# Patient Record
Sex: Female | Born: 1970 | Race: White | Hispanic: No | Marital: Married | Smoking: Never smoker
Health system: Southern US, Community
[De-identification: ages and names within clinical notes are randomized; demographics above are authoritative.]

## PROBLEM LIST (undated history)

## (undated) DIAGNOSIS — N6019 Diffuse cystic mastopathy of unspecified breast: Secondary | ICD-10-CM

## (undated) DIAGNOSIS — G4720 Circadian rhythm sleep disorder, unspecified type: Secondary | ICD-10-CM

## (undated) DIAGNOSIS — N920 Excessive and frequent menstruation with regular cycle: Secondary | ICD-10-CM

## (undated) HISTORY — DX: Diffuse cystic mastopathy of unspecified breast: N60.19

## (undated) HISTORY — DX: Circadian rhythm sleep disorder, unspecified type: G47.20

## (undated) HISTORY — PX: BREAST SURGERY: SHX581

## (undated) HISTORY — DX: Excessive and frequent menstruation with regular cycle: N92.0

## (undated) HISTORY — PX: OTHER SURGICAL HISTORY: SHX169

---

## 1991-06-15 HISTORY — PX: BREAST EXCISIONAL BIOPSY: SUR124

## 2006-11-17 ENCOUNTER — Ambulatory Visit: Payer: Self-pay

## 2007-10-09 ENCOUNTER — Ambulatory Visit: Payer: Self-pay | Admitting: Cardiology

## 2011-03-18 ENCOUNTER — Ambulatory Visit: Payer: Self-pay | Admitting: Internal Medicine

## 2011-04-01 ENCOUNTER — Ambulatory Visit: Payer: Self-pay | Admitting: Internal Medicine

## 2011-04-01 DIAGNOSIS — Z0289 Encounter for other administrative examinations: Secondary | ICD-10-CM

## 2012-03-30 LAB — HM PAP SMEAR: HM PAP: NEGATIVE

## 2012-04-20 ENCOUNTER — Ambulatory Visit: Payer: Self-pay | Admitting: Obstetrics and Gynecology

## 2013-04-11 ENCOUNTER — Ambulatory Visit: Payer: Self-pay | Admitting: Emergency Medicine

## 2013-04-11 LAB — COMPREHENSIVE METABOLIC PANEL
Albumin: 4 g/dL (ref 3.4–5.0)
Alkaline Phosphatase: 53 U/L (ref 50–136)
BUN: 11 mg/dL (ref 7–18)
Bilirubin,Total: 0.3 mg/dL (ref 0.2–1.0)
Calcium, Total: 9 mg/dL (ref 8.5–10.1)
Co2: 27 mmol/L (ref 21–32)
Creatinine: 0.73 mg/dL (ref 0.60–1.30)
Glucose: 126 mg/dL — ABNORMAL HIGH (ref 65–99)
Osmolality: 278 (ref 275–301)
SGOT(AST): 15 U/L (ref 15–37)
SGPT (ALT): 31 U/L (ref 12–78)
Sodium: 139 mmol/L (ref 136–145)
Total Protein: 7.7 g/dL (ref 6.4–8.2)

## 2013-04-11 LAB — CBC WITH DIFFERENTIAL/PLATELET
Basophil #: 0.1 10*3/uL (ref 0.0–0.1)
Basophil %: 0.8 %
HCT: 36.2 % (ref 35.0–47.0)
HGB: 12.3 g/dL (ref 12.0–16.0)
Lymphocyte %: 22.8 %
MCHC: 34.1 g/dL (ref 32.0–36.0)
MCV: 89 fL (ref 80–100)
Monocyte %: 8.8 %
Neutrophil #: 5.2 10*3/uL (ref 1.4–6.5)
Neutrophil %: 66.7 %
Platelet: 283 10*3/uL (ref 150–440)
RBC: 4.07 10*6/uL (ref 3.80–5.20)
RDW: 12.4 % (ref 11.5–14.5)

## 2013-04-11 LAB — TROPONIN I: Troponin-I: <0.02 ng/mL

## 2013-08-23 ENCOUNTER — Ambulatory Visit: Payer: Self-pay | Admitting: Obstetrics and Gynecology

## 2014-05-16 IMAGING — CR DG CHEST 2V
1 series · 2 of 2 positions shown · non-contrast
Comparison: none

REASON FOR EXAM: Dx: chest pain
COMMENTS:

PROCEDURE:     DXR - DXR CHEST PA (OR AP) AND LATERAL  - April 11, 2013  [DATE]
RESULT:     The lungs are clear. The heart and pulmonary vessels are normal.
The bony and mediastinal structures are unremarkable. There is no effusion.
There is no pneumothorax or evidence of congestive failure.

[Series 1: w chest pa · 0.14mm/px · 2 of 2 slices shown]
[im 1/2]
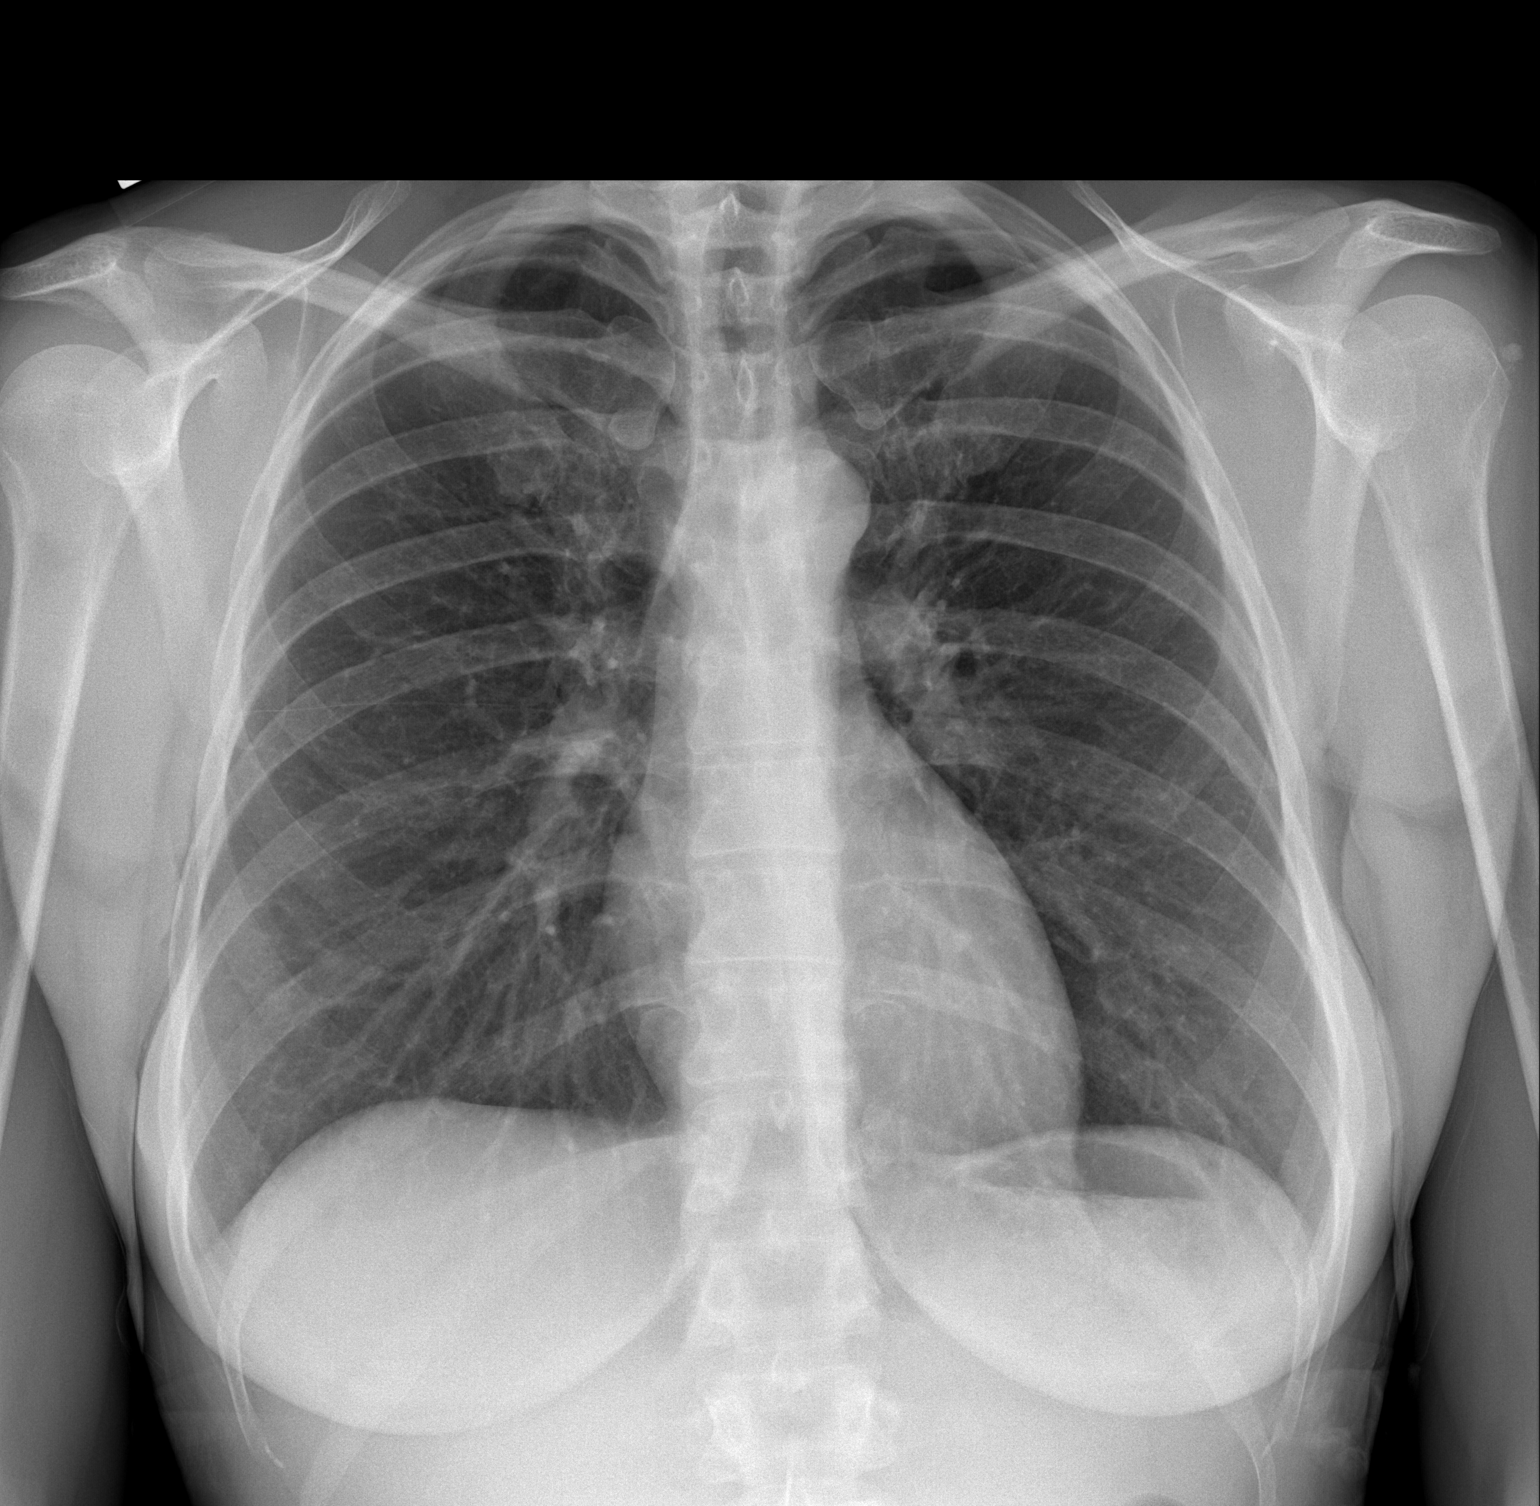
[im 2/2]
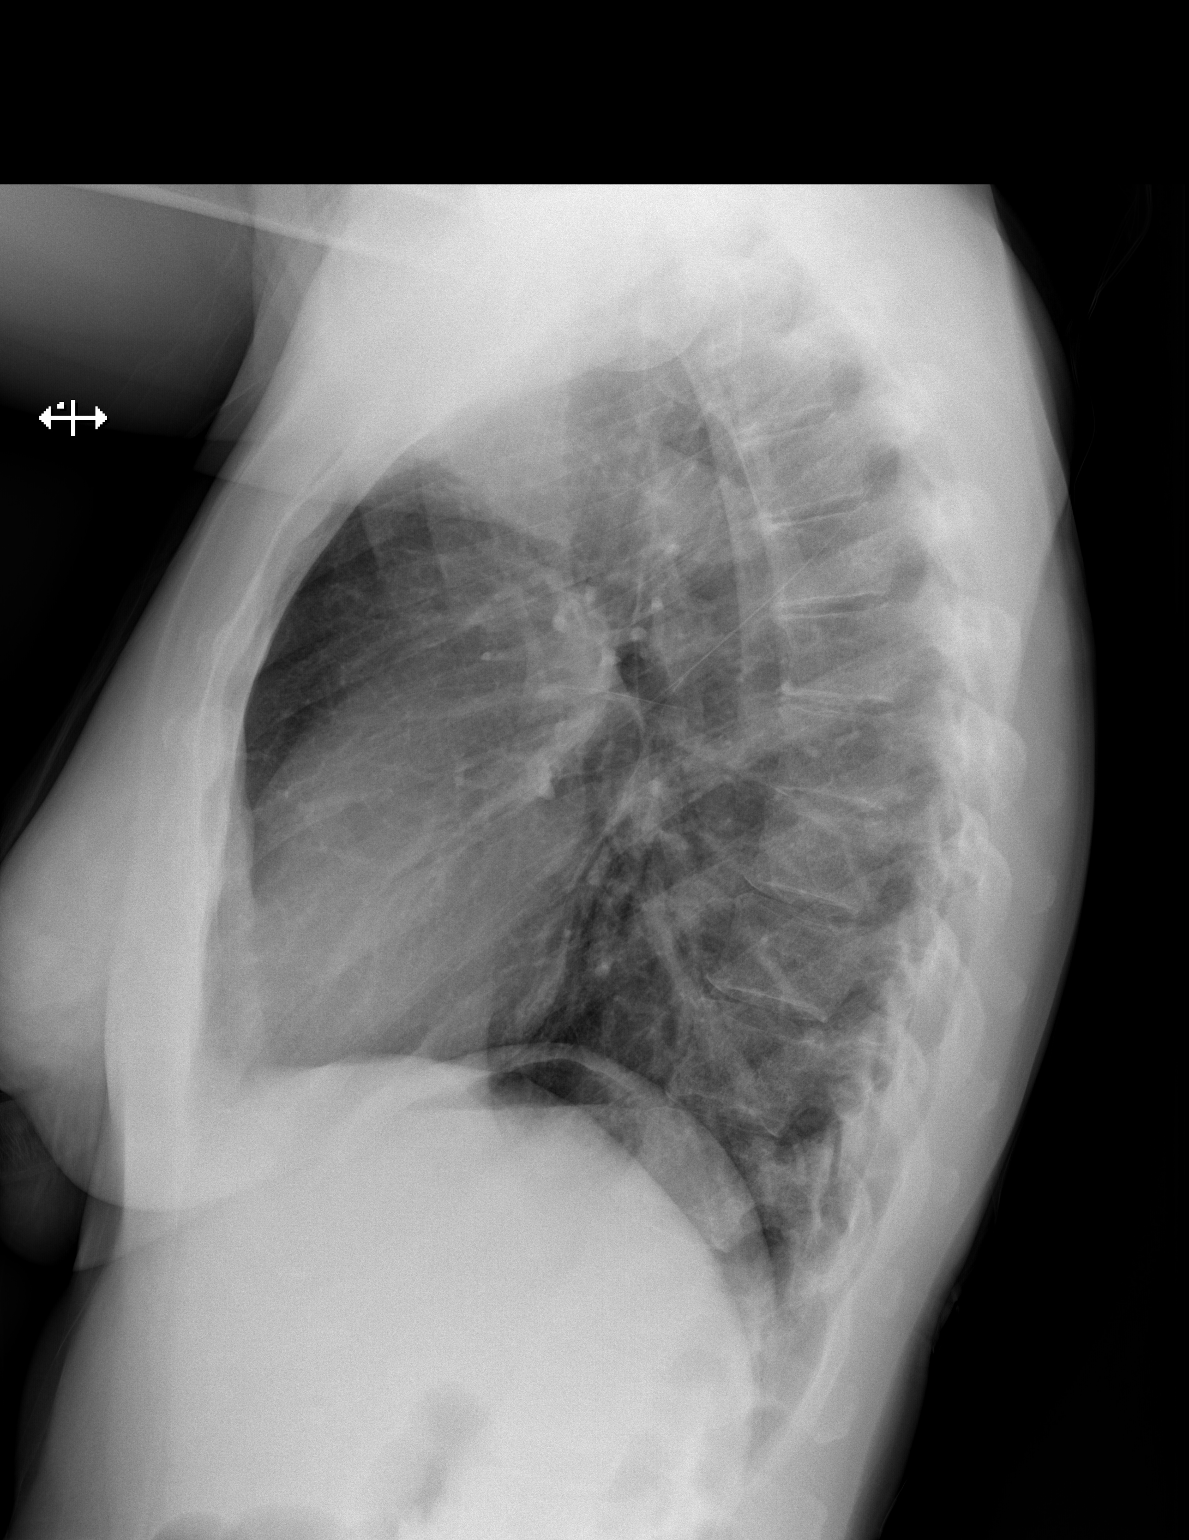

[2 of 2 positions shown; findings below may reference images not displayed]

IMPRESSION: No acute cardiopulmonary disease.

[REDACTED]

## 2015-03-04 ENCOUNTER — Encounter: Payer: Self-pay | Admitting: Podiatry

## 2015-03-04 ENCOUNTER — Ambulatory Visit (INDEPENDENT_AMBULATORY_CARE_PROVIDER_SITE_OTHER): Payer: BLUE CROSS/BLUE SHIELD | Admitting: Podiatry

## 2015-03-04 VITALS — BP 114/70 | HR 83 | Resp 18

## 2015-03-04 DIAGNOSIS — B351 Tinea unguium: Secondary | ICD-10-CM | POA: Diagnosis not present

## 2015-03-04 MED ORDER — CICLOPIROX 8 % EX SOLN: Freq: Every day | CUTANEOUS | Status: DC

## 2015-03-04 NOTE — Patient Instructions (Signed)

## 2015-03-04 NOTE — Progress Notes (Signed)
   Subjective:    Patient ID: Cheryl Bond, female    DOB: 12/26/1970, 44 y.o.   MRN: [id number]  HPI  44 year old female presents the office so concerns of toenail fungus to both of her big toes which is been ongoing the last several months. She states that she did have injury to the right big toenail proximal and one year ago when a stretcher rolled over her big toe. She does gets pedicures. She has tried over-the-counter treatments for 1 month for toenail fungus. She states that helps some although did not relieve her symptoms completely. She denies any pain to the toenail this time and she denies any surrounding redness or drainage. She states the right side is worse than the left. No other complaints at this time  Review of Systems  All other systems reviewed and are negative.      Objective:   Physical Exam AAO x3, NAD DP/PT pulses palpable bilaterally, CRT less than 3 seconds Protective sensation intact with Simms Weinstein monofilament, vibratory sensation intact, Achilles tendon reflex intact Bilateral hallux nails are hypertrophic, dystrophic, brittle, discolored, elongated with the right side worse the left. There is no surrounding erythema or drainage. There is no tenderness to palpation around the toenails. No areas of tenderness to bilateral lower extremities. MMT 5/5, ROM WNL.  No open lesions or pre-ulcerative lesions.  No overlying edema, erythema, increase in warmth to bilateral lower extremities.  No pain with calf compression, swelling, warmth, erythema bilaterally.      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 Nails were sent to [facility] for evaluation of onyhcomycosis. She will likely proceed with topical treatment. Will go ahead with Penlac.  -Follow-up 3 months  or sooner if any problems arise. In the meantime, encouraged to call the office with any questions, concerns, change in symptoms.    Celesta Gentile, DPM

## 2015-03-25 ENCOUNTER — Telehealth: Payer: Self-pay | Admitting: *Deleted

## 2015-03-25 NOTE — Telephone Encounter (Signed)
Pt states received a call from [facility] stating biopsy results in make an appt as soon as possible.  Referred to schedulers to call pt.

## 2015-04-01 ENCOUNTER — Ambulatory Visit (INDEPENDENT_AMBULATORY_CARE_PROVIDER_SITE_OTHER): Payer: BLUE CROSS/BLUE SHIELD | Admitting: Podiatry

## 2015-04-01 ENCOUNTER — Encounter: Payer: Self-pay | Admitting: Podiatry

## 2015-04-01 VITALS — BP 107/70 | HR 72 | Resp 18

## 2015-04-01 DIAGNOSIS — B351 Tinea unguium: Secondary | ICD-10-CM

## 2015-04-01 MED ORDER — EFINACONAZOLE 10 % EX SOLN: 1.0000 [drp] | Freq: Every day | CUTANEOUS | Status: DC

## 2015-04-03 NOTE — Progress Notes (Signed)
    Patient ID: NASHIYA DISBROW, female    DOB: May 13, 1971, 43 y.o.   MRN: [id number]  Subjective: 44 year old female presents the office to discuss nail biopsy results. She's been using the Penlac has not noticed any change. She continues to the both of her big toenails are thick and discolored. Denies any pain to the toenails. Denies any surrounding redness or drainage. No other complaints at this time.  Objective: AAO x3, NAD DP/PT pulses palpable bilaterally, CRT less than 3 seconds Protective sensation intact with Simms Weinstein monofilament Bilateral hallux nails continue to be hypertrophic, dystrophic, brittle, discolored, elongated with the right side worse the left. There is no surrounding erythema or drainage. There is no tenderness to palpation around the toenails. No areas of tenderness to bilateral lower extremities. No open lesions or pre-ulcerative lesions.  No overlying edema, erythema, increase in warmth to bilateral lower extremities.  No pain with calf compression, swelling, warmth, erythema bilaterally.   44 year old female with bilateral hallux onychodystrophy, likely onychomycosis -Treatment options discussed including all alternatives, risks, and complications -She is noticing any improvement with the Penlac. I discussed the nail biopsy results. Discussed with the oral treatment is likely more effective for her particular type of fungus how she wishes to hold off on pills. We will try Jublia. Application instructions and side effects discussed.    Celesta Gentile, DPM

## 2015-04-10 ENCOUNTER — Encounter: Payer: Self-pay | Admitting: *Deleted

## 2015-04-11 ENCOUNTER — Other Ambulatory Visit: Payer: Self-pay | Admitting: Obstetrics and Gynecology

## 2015-04-11 ENCOUNTER — Encounter: Payer: Self-pay | Admitting: Obstetrics and Gynecology

## 2015-04-11 ENCOUNTER — Ambulatory Visit: Payer: BLUE CROSS/BLUE SHIELD | Admitting: Obstetrics and Gynecology

## 2015-04-11 VITALS — BP 104/73 | HR 71 | Ht 66.0 in | Wt 155.4 lb

## 2015-04-11 DIAGNOSIS — Z01419 Encounter for gynecological examination (general) (routine) without abnormal findings: Secondary | ICD-10-CM

## 2015-04-11 NOTE — Progress Notes (Incomplete)
[name]  ENCOUNTER NOTE  Subjective:       Cheryl Bond is a 44 y.o. No obstetric history on file. female here for a routine annual gynecologic exam.  Current complaints: 1.  Annual well exam. Normal cycles, mild dysmenorrhea/mastalgia, no vaginal discharge or itching, no abnormal bleeding or spotting. Denies urinary symptoms, change in BM. Denies hot flashes, night sweats. 2.  Weight gain - had episode of bursitis and has decreased exercise since then   Gynecologic History Patient's last menstrual period was 03/17/2015 (exact date). Regular 25 day cycles with 4 days of bleeding.  Contraception: vasectomy Last Pap: . Results were: normal - never had abnormal Last mammogram: 2014 - not exact. Results were: normal. History of fibrocystic changes in 1990's.  Performs frequent self breast exams  Obstetric History OB History  No data available    Past Medical History  Diagnosis Date  . Fibrocystic breast     Past Surgical History  Procedure Laterality Date  . Toenail surgery      BI-LATERAL GREAT TOENAILS REMOVED  . Breast surgery      RIGHT BREAST [phone]    Current Outpatient Prescriptions on File Prior to Visit  Medication Sig Dispense Refill  . ciclopirox (PENLAC) 8 % solution Apply topically at bedtime. Apply over nail and surrounding skin. Apply daily over previous coat. After seven (7) days, may remove with alcohol and continue cycle. (Patient not taking: Reported on 04/11/2015) 6.6 mL 3  . Efinaconazole 10 % SOLN Apply 1 drop topically daily. (Patient not taking: Reported on 04/11/2015) 4 mL 11   No current facility-administered medications on file prior to visit.    No Known Allergies  Social History   Social History  . Marital Status: Married    Spouse Name: N/A  . Number of Children: N/A  . Years of Education: N/A   Occupational History  . Not on file.   Social History Main Topics  . Smoking status: Never Smoker   . Smokeless  tobacco: Never Used  . Alcohol Use: No  . Drug Use: No  . Sexual Activity: Yes     Comment: husband-vasectomy   Other Topics Concern  . Not on file   Social History Narrative    Family History  Problem Relation Age of Onset  . Heart disease Maternal Grandmother   . Heart disease Paternal Grandmother   . Heart disease Paternal Grandfather     The following portions of the patient's history were reviewed and updated as appropriate: allergies, current medications, past family history, past medical history, past social history, past surgical history and problem list.  Review of Systems ROS Review of Systems -  General ROS: negative for - chills, fatigue, fever, hot flashes, night sweats. Positive for weight gain. Hematological and Lymphatic ROS: negative for - bleeding problems, bruising, swollen lymph nodes or weight loss Endocrine ROS: negative for - hot flashes, malaise/lethargy, palpitations, fatigue Breast ROS: negative for - new or changing breast lumps or nipple discharge Respiratory ROS: negative for - cough or shortness of breath Cardiovascular ROS: negative for - chest pain, irregular heartbeat Gastrointestinal ROS: no abdominal pain, change in bowel habits, or black or bloody stools Genito-Urinary ROS: no dysuria, trouble voiding, or hematuria Musculoskeletal ROS: negative for - joint pain or joint stiffness Neurological ROS: negative for - bowel and bladder control changes Dermatological ROS: negative for rash and skin lesion changes   Objective:   BP 104/73 mmHg  Pulse 71  Ht 5' 6"  (1.676 m)  Wt 155 lb 6.4 oz (70.489 kg)  BMI 25.09 kg/m2  LMP 03/17/2015 (Exact Date) CONSTITUTIONAL: Well-developed, well-nourished female in no acute distress.  PSYCHIATRIC: Normal mood and affect. Normal behavior.[name] [place]: Alert and oriented to person, place, and time. No cranial nerve deficit noted. HENT:  Normocephalic, atraumatic. Oropharynx is clear and moist EYES:  Conjunctivae and EOM are normal.  NECK: Normal range of motion, supple, no masses.  Normal thyroid.  SKIN: Skin is warm and dry. No rash noted. Tan. CARDIOVASCULAR: Normal heart rate noted, regular rhythm, no murmur. RESPIRATORY: Clear to auscultation bilaterally. Effort and breath sounds normal. BREASTS: Symmetric in size. No masses, skin changes, nipple drainage, or lymphadenopathy. ABDOMEN: Soft, normal bowel sounds, no distention noted.  No tenderness, rebound or guarding.  BLADDER: Normal PELVIC:  External Genitalia: Normal  BUS: Normal  Vagina: Normal  Cervix: Normal  Uterus: Normal, mid line  Adnexa: No masses palpable, nontender  RV: External Exam NormaI  LYMPHATIC: No Axillary, Supraclavicular, or Inguinal Adenopathy.    Assessment:   Annual gynecologic examination 44 y.o. - Well woman exam with routine gynecologic exam Contraception: vasectomy Overweight   Plan:  Pap: Pap, Reflex if ASCUS *** Mammogram: Ordered Stool Guaiac Testing:  Not indicated Labs: Lipid 1, FBS, TSH and Vit D Level""CBC, CMP Routine preventative health maintenance measures emphasized: Exercise/Diet/Weight control, Tobacco Warnings and Alcohol/Substance use risks Discussed weight loss options including phentermine/vitamin B. Patient will return for Rx and counseling.  Return to [facility], [place], [place]

## 2015-04-11 NOTE — Patient Instructions (Addendum)
1.  Well woman exam with pap smear and HPV testing performed today  2.  Continue exercise as tolerated, and healthy eating habits to help with weight loss 3.  Screening labs to be drawn today include: Complete blood count, comprehensive metabolic panel, lipid panel, thyroid hormones, and vitamin D levels 4.  Mammogram ordered today. Continue regular self breast exams. 5..  Return in 1 year for annual exam

## 2015-04-11 NOTE — Progress Notes (Unsigned)
[facility]  ENCOUNTER NOTE  Subjective:       Cheryl Bond is a 44 y.o. No obstetric history on file. female here for a routine annual gynecologic exam.  Current complaints: 1.  Annual well exam. Normal cycles, mild dysmenorrhea/mastalgia, no vaginal discharge or itching, no abnormal bleeding or spotting. Denies urinary symptoms, change in BM. Denies hot flashes, night sweats. 2.  Weight gain - had episode of bursitis and has decreased exercise since then   Gynecologic History Patient's last menstrual period was 03/17/2015 (exact date). Regular 25 day cycles with 4 days of bleeding.  Contraception: vasectomy Last Pap: . Results were: normal - never had abnormal Last mammogram: 2014 - not exact. Results were: normal. History of fibrocystic changes in 1990's.  Performs frequent self breast exams  Obstetric History OB History  No data available    Past Medical History  Diagnosis Date  . Fibrocystic breast     Past Surgical History  Procedure Laterality Date  . Toenail surgery      BI-LATERAL GREAT TOENAILS REMOVED  . Breast surgery      RIGHT BREAST [phone]    Current Outpatient Prescriptions on File Prior to Visit  Medication Sig Dispense Refill  . ciclopirox (PENLAC) 8 % solution Apply topically at bedtime. Apply over nail and surrounding skin. Apply daily over previous coat. After seven (7) days, may remove with alcohol and continue cycle. (Patient not taking: Reported on 04/11/2015) 6.6 mL 3  . Efinaconazole 10 % SOLN Apply 1 drop topically daily. (Patient not taking: Reported on 04/11/2015) 4 mL 11   No current facility-administered medications on file prior to visit.    No Known Allergies  Social History   Social History  . Marital Status: Married    Spouse Name: N/A  . Number of Children: N/A  . Years of Education: N/A   Occupational History  . Not on file.   Social History Main Topics  . Smoking status: Never Smoker   . Smokeless  tobacco: Never Used  . Alcohol Use: No  . Drug Use: No  . Sexual Activity: Yes     Comment: husband-vasectomy   Other Topics Concern  . Not on file   Social History Narrative    Family History  Problem Relation Age of Onset  . Heart disease Maternal Grandmother   . Heart disease Paternal Grandmother   . Heart disease Paternal Grandfather     The following portions of the patient's history were reviewed and updated as appropriate: allergies, current medications, past family history, past medical history, past social history, past surgical history and problem list.  Review of Systems ROS Review of Systems -  General ROS: negative for - chills, fatigue, fever, hot flashes, night sweats. Positive for weight gain. Hematological and Lymphatic ROS: negative for - bleeding problems, bruising, swollen lymph nodes or weight loss Endocrine ROS: negative for - hot flashes, malaise/lethargy, palpitations, fatigue Breast ROS: negative for - new or changing breast lumps or nipple discharge Respiratory ROS: negative for - cough or shortness of breath Cardiovascular ROS: negative for - chest pain, irregular heartbeat Gastrointestinal ROS: no abdominal pain, change in bowel habits, or black or bloody stools Genito-Urinary ROS: no dysuria, trouble voiding, or hematuria Musculoskeletal ROS: negative for - joint pain or joint stiffness Neurological ROS: negative for - bowel and bladder control changes Dermatological ROS: negative for rash and skin lesion changes   Objective:   BP 104/73 mmHg  Pulse 71  Ht 5' 6"  (1.676 m)  Wt 155 lb 6.4 oz (70.489 kg)  BMI 25.09 kg/m2  LMP 03/17/2015 (Exact Date) CONSTITUTIONAL: Well-developed, well-nourished female in no acute distress.  PSYCHIATRIC: Normal mood and affect. Normal behavior.Cheryl Bond [place]: Alert and oriented to person, place, and time. No cranial nerve deficit noted. HENT:  Normocephalic, atraumatic. Oropharynx is clear and moist EYES:  Conjunctivae and EOM are normal.  NECK: Normal range of motion, supple, no masses.  Normal thyroid.  SKIN: Skin is warm and dry. No rash noted. Tan. CARDIOVASCULAR: Normal heart rate noted, regular rhythm, no murmur. RESPIRATORY: Clear to auscultation bilaterally. Effort and breath sounds normal. BREASTS: Symmetric in size. No masses, skin changes, nipple drainage, or lymphadenopathy. ABDOMEN: Soft, normal bowel sounds, no distention noted.  No tenderness, rebound or guarding.  BLADDER: Normal PELVIC:  External Genitalia: Normal  BUS: Normal  Vagina: Normal  Cervix: Normal  Uterus: Normal, mid line  Adnexa: No masses palpable, nontender  RV: External Exam NormaI  LYMPHATIC: No Axillary, Supraclavicular, or Inguinal Adenopathy.    Assessment:   Annual gynecologic examination 44 y.o. - Well woman exam with routine gynecologic exam Contraception: vasectomy Overweight   Plan:  Pap: Pap, Reflex if ASCUS  Mammogram: Ordered Stool Guaiac Testing:  Not indicated Labs: Lipid 1, FBS, TSH and Vit D Level""CBC, CMP Routine preventative health maintenance measures emphasized: Exercise/Diet/Weight control, Tobacco Warnings and Alcohol/Substance use risks Discussed weight loss options including phentermine/vitamin B. Patient will return for Rx and counseling.  Return to [facility], [place], [place]

## 2015-04-12 LAB — CBC
Hematocrit: 39.7 % (ref 34.0–46.6)
Hemoglobin: 13.3 g/dL (ref 11.1–15.9)
MCH: 30.4 pg (ref 26.6–33.0)
MCHC: 33.5 g/dL (ref 31.5–35.7)
MCV: 91 fL (ref 79–97)
PLATELETS: 310 10*3/uL (ref 150–379)
RBC: 4.38 x10E6/uL (ref 3.77–5.28)
RDW: 12.8 % (ref 12.3–15.4)
WBC: 6.8 10*3/uL (ref 3.4–10.8)

## 2015-04-12 LAB — THYROID PANEL WITH TSH
Free Thyroxine Index: 2.4 (ref 1.2–4.9)
T3 UPTAKE RATIO: 32 % (ref 24–39)
T4, Total: 7.4 ug/dL (ref 4.5–12.0)
TSH: 0.791 u[IU]/mL (ref 0.450–4.500)

## 2015-04-12 LAB — COMPREHENSIVE METABOLIC PANEL
ALBUMIN: 4.5 g/dL (ref 3.5–5.5)
ALK PHOS: 44 IU/L (ref 39–117)
ALT: 15 IU/L (ref 0–32)
AST: 15 IU/L (ref 0–40)
Albumin/Globulin Ratio: 1.7 (ref 1.1–2.5)
BILIRUBIN TOTAL: 0.5 mg/dL (ref 0.0–1.2)
BUN/Creatinine Ratio: 17 (ref 9–23)
BUN: 14 mg/dL (ref 6–24)
CHLORIDE: 101 mmol/L (ref 97–106)
CO2: 25 mmol/L (ref 18–29)
Calcium: 9 mg/dL (ref 8.7–10.2)
Creatinine, Ser: 0.83 mg/dL (ref 0.57–1.00)
GFR calc Af Amer: 100 mL/min/{1.73_m2} (ref >59)
GFR calc non Af Amer: 87 mL/min/{1.73_m2} (ref >59)
GLUCOSE: 63 mg/dL — AB (ref 65–99)
Globulin, Total: 2.6 g/dL (ref 1.5–4.5)
Potassium: 3.9 mmol/L (ref 3.5–5.2)
Sodium: 138 mmol/L (ref 136–144)
Total Protein: 7.1 g/dL (ref 6.0–8.5)

## 2015-04-12 LAB — LIPID PANEL
CHOL/HDL RATIO: 2.3 ratio (ref 0.0–4.4)
Cholesterol, Total: 223 mg/dL — ABNORMAL HIGH (ref 100–199)
HDL: 96 mg/dL (ref >39)
LDL Calculated: 113 mg/dL — ABNORMAL HIGH (ref 0–99)
Triglycerides: 68 mg/dL (ref 0–149)
VLDL CHOLESTEROL CAL: 14 mg/dL (ref 5–40)

## 2015-04-14 LAB — CYTOLOGY - PAP

## 2015-04-22 ENCOUNTER — Telehealth: Payer: Self-pay | Admitting: *Deleted

## 2015-04-22 NOTE — Telephone Encounter (Signed)
Left detailed message about lab results

## 2015-04-22 NOTE — Telephone Encounter (Signed)
Left detailed message for pt about results

## 2015-04-22 NOTE — Telephone Encounter (Signed)
-----   Message from Evonnie Pat, [place] sent at 04/22/2015  1:47 AM EST ----- Please let her know pap and labs were all normal except chol was slightly elevated, needs to lower cholesterol intake in diet, and will recheck next year.

## 2015-05-19 ENCOUNTER — Ambulatory Visit (INDEPENDENT_AMBULATORY_CARE_PROVIDER_SITE_OTHER): Payer: BLUE CROSS/BLUE SHIELD | Admitting: Obstetrics and Gynecology

## 2015-05-19 VITALS — BP 104/71 | HR 83 | Ht 66.0 in | Wt 155.6 lb

## 2015-05-19 DIAGNOSIS — R635 Abnormal weight gain: Secondary | ICD-10-CM

## 2015-05-19 MED ORDER — CYANOCOBALAMIN 1000 MCG/ML IJ SOLN
1000.0000 ug | Freq: Once | INTRAMUSCULAR | Status: AC
  Administered 2015-05-19: 1000 ug via INTRAMUSCULAR

## 2015-05-19 NOTE — Progress Notes (Cosign Needed)
Patient ID: Cheryl Bond, female   DOB: December 21, 1970, 44 y.o.   MRN: [id number] Pt presents for weight, B/P, B-12 injection. No side effects of medication-Phentermine, or B-12.  Weight loss/gain of 0 lbs. Encouraged eating healthy and exercise.

## 2015-05-27 ENCOUNTER — Ambulatory Visit: Payer: BLUE CROSS/BLUE SHIELD | Admitting: Podiatry

## 2015-09-30 ENCOUNTER — Ambulatory Visit: Payer: BLUE CROSS/BLUE SHIELD | Admitting: Podiatry

## 2016-04-12 ENCOUNTER — Encounter: Payer: Self-pay | Admitting: Podiatry

## 2016-04-13 ENCOUNTER — Ambulatory Visit (INDEPENDENT_AMBULATORY_CARE_PROVIDER_SITE_OTHER): Payer: 59 | Admitting: Obstetrics and Gynecology

## 2016-04-13 ENCOUNTER — Encounter: Payer: Self-pay | Admitting: Obstetrics and Gynecology

## 2016-04-13 ENCOUNTER — Encounter: Payer: BLUE CROSS/BLUE SHIELD | Admitting: Obstetrics and Gynecology

## 2016-04-13 VITALS — BP 112/70 | HR 68 | Ht 66.0 in | Wt 164.8 lb

## 2016-04-13 DIAGNOSIS — Z01419 Encounter for gynecological examination (general) (routine) without abnormal findings: Secondary | ICD-10-CM

## 2016-04-13 NOTE — Patient Instructions (Signed)
Preventive Care for Adults, Female A healthy lifestyle and preventive care can promote health and wellness. Preventive health guidelines for women include the following key practices.  A routine yearly physical is a good way to check with your health care provider about your health and preventive screening. It is a chance to share any concerns and updates on your health and to receive a thorough exam.  Visit your dentist for a routine exam and preventive care every 6 months. Brush your teeth twice a day and floss once a day. Good oral hygiene prevents tooth decay and gum disease.  The frequency of eye exams is based on your age, health, family medical history, use of contact lenses, and other factors. Follow your health care provider's recommendations for frequency of eye exams.  Eat a healthy diet. Foods like vegetables, fruits, whole grains, low-fat dairy products, and lean protein foods contain the nutrients you need without too many calories. Decrease your intake of foods high in solid fats, added sugars, and salt. Eat the right amount of calories for you.Get information about a proper diet from your health care provider, if necessary.  Regular physical exercise is one of the most important things you can do for your health. Most adults should get at least 150 minutes of moderate-intensity exercise (any activity that increases your heart rate and causes you to sweat) each week. In addition, most adults need muscle-strengthening exercises on 2 or more days a week.  Maintain a healthy weight. The body mass index (BMI) is a screening tool to identify possible weight problems. It provides an estimate of body fat based on height and weight. Your health care provider can find your BMI and can help you achieve or maintain a healthy weight.For adults 20 years and older:  A BMI below 18.5 is considered underweight.  A BMI of 18.5 to 24.9 is normal.  A BMI of 25 to 29.9 is considered  overweight.  A BMI of 30 and above is considered obese.  Maintain normal blood lipids and cholesterol levels by exercising and minimizing your intake of saturated fat. Eat a balanced diet with plenty of fruit and vegetables. Blood tests for lipids and cholesterol should begin at age 64 and be repeated every 5 years. If your lipid or cholesterol levels are high, you are over 50, or you are at high risk for heart disease, you may need your cholesterol levels checked more frequently.Ongoing high lipid and cholesterol levels should be treated with medicines if diet and exercise are not working.  If you smoke, find out from your health care provider how to quit. If you do not use tobacco, do not start.  Lung cancer screening is recommended for adults aged 52-80 years who are at high risk for developing lung cancer because of a history of smoking. A yearly low-dose CT scan of the lungs is recommended for people who have at least a 30-pack-year history of smoking and are a current smoker or have quit within the past 15 years. A pack year of smoking is smoking an average of 1 pack of cigarettes a day for 1 year (for example: 1 pack a day for 30 years or 2 packs a day for 15 years). Yearly screening should continue until the smoker has stopped smoking for at least 15 years. Yearly screening should be stopped for people who develop a health problem that would prevent them from having lung cancer treatment.  If you are pregnant, do not drink alcohol. If you are  breastfeeding, be very cautious about drinking alcohol. If you are not pregnant and choose to drink alcohol, do not have more than 1 drink per day. One drink is considered to be 12 ounces (355 mL) of beer, 5 ounces (148 mL) of wine, or 1.5 ounces (44 mL) of liquor.  Avoid use of street drugs. Do not share needles with anyone. Ask for help if you need support or instructions about stopping the use of drugs.  High blood pressure causes heart disease and  increases the risk of stroke. Your blood pressure should be checked at least every 1 to 2 years. Ongoing high blood pressure should be treated with medicines if weight loss and exercise do not work.  If you are 25-78 years old, ask your health care provider if you should take aspirin to prevent strokes.  Diabetes screening is done by taking a blood sample to check your blood glucose level after you have not eaten for a certain period of time (fasting). If you are not overweight and you do not have risk factors for diabetes, you should be screened once every 3 years starting at age 86. If you are overweight or obese and you are 3-87 years of age, you should be screened for diabetes every year as part of your cardiovascular risk assessment.  Breast cancer screening is essential preventive care for women. You should practice "breast self-awareness." This means understanding the normal appearance and feel of your breasts and may include breast self-examination. Any changes detected, no matter how small, should be reported to a health care provider. Women in their 66s and 30s should have a clinical breast exam (CBE) by a health care provider as part of a regular health exam every 1 to 3 years. After age 43, women should have a CBE every year. Starting at age 37, women should consider having a mammogram (breast X-ray test) every year. Women who have a family history of breast cancer should talk to their health care provider about genetic screening. Women at a high risk of breast cancer should talk to their health care providers about having an MRI and a mammogram every year.  Breast cancer gene (BRCA)-related cancer risk assessment is recommended for women who have family members with BRCA-related cancers. BRCA-related cancers include breast, ovarian, tubal, and peritoneal cancers. Having family members with these cancers may be associated with an increased risk for harmful changes (mutations) in the breast  cancer genes BRCA1 and BRCA2. Results of the assessment will determine the need for genetic counseling and BRCA1 and BRCA2 testing.  Your health care provider may recommend that you be screened regularly for cancer of the pelvic organs (ovaries, uterus, and vagina). This screening involves a pelvic examination, including checking for microscopic changes to the surface of your cervix (Pap test). You may be encouraged to have this screening done every 3 years, beginning at age 78.  For women ages 79-65, health care providers may recommend pelvic exams and Pap testing every 3 years, or they may recommend the Pap and pelvic exam, combined with testing for human papilloma virus (HPV), every 5 years. Some types of HPV increase your risk of cervical cancer. Testing for HPV may also be done on women of any age with unclear Pap test results.  Other health care providers may not recommend any screening for nonpregnant women who are considered low risk for pelvic cancer and who do not have symptoms. Ask your health care provider if a screening pelvic exam is right for  you.  If you have had past treatment for cervical cancer or a condition that could lead to cancer, you need Pap tests and screening for cancer for at least 20 years after your treatment. If Pap tests have been discontinued, your risk factors (such as having a new sexual partner) need to be reassessed to determine if screening should resume. Some women have medical problems that increase the chance of getting cervical cancer. In these cases, your health care provider may recommend more frequent screening and Pap tests.  Colorectal cancer can be detected and often prevented. Most routine colorectal cancer screening begins at the age of 50 years and continues through age 75 years. However, your health care provider may recommend screening at an earlier age if you have risk factors for colon cancer. On a yearly basis, your health care provider may provide  home test kits to check for hidden blood in the stool. Use of a small camera at the end of a tube, to directly examine the colon (sigmoidoscopy or colonoscopy), can detect the earliest forms of colorectal cancer. Talk to your health care provider about this at age 50, when routine screening begins. Direct exam of the colon should be repeated every 5-10 years through age 75 years, unless early forms of precancerous polyps or small growths are found.  People who are at an increased risk for hepatitis B should be screened for this virus. You are considered at high risk for hepatitis B if:  You were born in a country where hepatitis B occurs often. Talk with your health care provider about which countries are considered high risk.  Your parents were born in a high-risk country and you have not received a shot to protect against hepatitis B (hepatitis B vaccine).  You have HIV or AIDS.  You use needles to inject street drugs.  You live with, or have sex with, someone who has hepatitis B.  You get hemodialysis treatment.  You take certain medicines for conditions like cancer, organ transplantation, and autoimmune conditions.  Hepatitis C blood testing is recommended for all people born from 1945 through 1965 and any individual with known risks for hepatitis C.  Practice safe sex. Use condoms and avoid high-risk sexual practices to reduce the spread of sexually transmitted infections (STIs). STIs include gonorrhea, chlamydia, syphilis, trichomonas, herpes, HPV, and human immunodeficiency virus (HIV). Herpes, HIV, and HPV are viral illnesses that have no cure. They can result in disability, cancer, and death.  You should be screened for sexually transmitted illnesses (STIs) including gonorrhea and chlamydia if:  You are sexually active and are younger than 24 years.  You are older than 24 years and your health care provider tells you that you are at risk for this type of infection.  Your sexual  activity has changed since you were last screened and you are at an increased risk for chlamydia or gonorrhea. Ask your health care provider if you are at risk.  If you are at risk of being infected with HIV, it is recommended that you take a prescription medicine daily to prevent HIV infection. This is called preexposure prophylaxis (PrEP). You are considered at risk if:  You are sexually active and do not regularly use condoms or know the HIV status of your partner(s).  You take drugs by injection.  You are sexually active with a partner who has HIV.  Talk with your health care provider about whether you are at high risk of being infected with HIV. If   you choose to begin PrEP, you should first be tested for HIV. You should then be tested every 3 months for as long as you are taking PrEP.  Osteoporosis is a disease in which the bones lose minerals and strength with aging. This can result in serious bone fractures or breaks. The risk of osteoporosis can be identified using a bone density scan. Women ages 1 years and over and women at risk for fractures or osteoporosis should discuss screening with their health care providers. Ask your health care provider whether you should take a calcium supplement or vitamin D to reduce the rate of osteoporosis.  Menopause can be associated with physical symptoms and risks. Hormone replacement therapy is available to decrease symptoms and risks. You should talk to your health care provider about whether hormone replacement therapy is right for you.  Use sunscreen. Apply sunscreen liberally and repeatedly throughout the day. You should seek shade when your shadow is shorter than you. Protect yourself by wearing long sleeves, pants, a wide-brimmed hat, and sunglasses year round, whenever you are outdoors.  Once a month, do a whole body skin exam, using a mirror to look at the skin on your back. Tell your health care provider of new moles, moles that have irregular  borders, moles that are larger than a pencil eraser, or moles that have changed in shape or color.  Stay current with required vaccines (immunizations).  Influenza vaccine. All adults should be immunized every year.  Tetanus, diphtheria, and acellular pertussis (Td, Tdap) vaccine. Pregnant women should receive 1 dose of Tdap vaccine during each pregnancy. The dose should be obtained regardless of the length of time since the last dose. Immunization is preferred during the 27th-36th week of gestation. An adult who has not previously received Tdap or who does not know her vaccine status should receive 1 dose of Tdap. This initial dose should be followed by tetanus and diphtheria toxoids (Td) booster doses every 10 years. Adults with an unknown or incomplete history of completing a 3-dose immunization series with Td-containing vaccines should begin or complete a primary immunization series including a Tdap dose. Adults should receive a Td booster every 10 years.  Varicella vaccine. An adult without evidence of immunity to varicella should receive 2 doses or a second dose if she has previously received 1 dose. Pregnant females who do not have evidence of immunity should receive the first dose after pregnancy. This first dose should be obtained before leaving the health care facility. The second dose should be obtained 4-8 weeks after the first dose.  Human papillomavirus (HPV) vaccine. Females aged 13-26 years who have not received the vaccine previously should obtain the 3-dose series. The vaccine is not recommended for use in pregnant females. However, pregnancy testing is not needed before receiving a dose. If a female is found to be pregnant after receiving a dose, no treatment is needed. In that case, the remaining doses should be delayed until after the pregnancy. Immunization is recommended for any person with an immunocompromised condition through the age of 24 years if she did not get any or all doses  earlier. During the 3-dose series, the second dose should be obtained 4-8 weeks after the first dose. The third dose should be obtained 24 weeks after the first dose and 16 weeks after the second dose.  Zoster vaccine. One dose is recommended for adults aged 97 years or older unless certain conditions are present.  Measles, mumps, and rubella (MMR) vaccine. Adults born  before 1957 generally are considered immune to measles and mumps. Adults born in [birth year] or later should have 1 or more doses of MMR vaccine unless there is a contraindication to the vaccine or there is laboratory evidence of immunity to each of the three diseases. A routine second dose of MMR vaccine should be obtained at least 28 days after the first dose for students attending postsecondary schools, health care workers, or international travelers. People who received inactivated measles vaccine or an unknown type of measles vaccine during 1963-1967 should receive 2 doses of MMR vaccine. People who received inactivated mumps vaccine or an unknown type of mumps vaccine before 1979 and are at high risk for mumps infection should consider immunization with 2 doses of MMR vaccine. For females of childbearing age, rubella immunity should be determined. If there is no evidence of immunity, females who are not pregnant should be vaccinated. If there is no evidence of immunity, females who are pregnant should delay immunization until after pregnancy. Unvaccinated health care workers born before 60 who lack laboratory evidence of measles, mumps, or rubella immunity or laboratory confirmation of disease should consider measles and mumps immunization with 2 doses of MMR vaccine or rubella immunization with 1 dose of MMR vaccine.  Pneumococcal 13-valent conjugate (PCV13) vaccine. When indicated, a person who is uncertain of his immunization history and has no record of immunization should receive the PCV13 vaccine. All adults 61 years of age and older  should receive this vaccine. An adult aged 92 years or older who has certain medical conditions and has not been previously immunized should receive 1 dose of PCV13 vaccine. This PCV13 should be followed with a dose of pneumococcal polysaccharide (PPSV23) vaccine. Adults who are at high risk for pneumococcal disease should obtain the PPSV23 vaccine at least 8 weeks after the dose of PCV13 vaccine. Adults older than 44 years of age who have normal immune system function should obtain the PPSV23 vaccine dose at least 1 year after the dose of PCV13 vaccine.  Pneumococcal polysaccharide (PPSV23) vaccine. When PCV13 is also indicated, PCV13 should be obtained first. All adults aged 2 years and older should be immunized. An adult younger than age 30 years who has certain medical conditions should be immunized. Any person who resides in a nursing home or long-term care facility should be immunized. An adult smoker should be immunized. People with an immunocompromised condition and certain other conditions should receive both PCV13 and PPSV23 vaccines. People with human immunodeficiency virus (HIV) infection should be immunized as soon as possible after diagnosis. Immunization during chemotherapy or radiation therapy should be avoided. Routine use of PPSV23 vaccine is not recommended for American Indians, [place] Natives, or people younger than 65 years unless there are medical conditions that require PPSV23 vaccine. When indicated, people who have unknown immunization and have no record of immunization should receive PPSV23 vaccine. One-time revaccination 5 years after the first dose of PPSV23 is recommended for people aged 19-64 years who have chronic kidney failure, nephrotic syndrome, asplenia, or immunocompromised conditions. People who received 1-2 doses of PPSV23 before age 44 years should receive another dose of PPSV23 vaccine at age 83 years or later if at least 5 years have passed since the previous dose. Doses  of PPSV23 are not needed for people immunized with PPSV23 at or after age 20 years.  Meningococcal vaccine. Adults with asplenia or persistent complement component deficiencies should receive 2 doses of quadrivalent meningococcal conjugate (MenACWY-D) vaccine. The doses should be obtained  at least 2 months apart. Microbiologists working with certain meningococcal bacteria, [place] recruits, people at risk during an outbreak, and people who travel to or live in countries with a high rate of meningitis should be immunized. A first-year college student up through age 28 years who is living in a residence hall should receive a dose if she did not receive a dose on or after her 16th birthday. Adults who have certain high-risk conditions should receive one or more doses of vaccine.  Hepatitis A vaccine. Adults who wish to be protected from this disease, have certain high-risk conditions, work with hepatitis A-infected animals, work in hepatitis A research labs, or travel to or work in countries with a high rate of hepatitis A should be immunized. Adults who were previously unvaccinated and who anticipate close contact with an international adoptee during the first 60 days after arrival in the [place] from a country with a high rate of hepatitis A should be immunized.  Hepatitis B vaccine. Adults who wish to be protected from this disease, have certain high-risk conditions, may be exposed to blood or other infectious body fluids, are household contacts or sex partners of hepatitis B positive people, are clients or workers in certain care facilities, or travel to or work in countries with a high rate of hepatitis B should be immunized.  Haemophilus influenzae type b (Hib) vaccine. A previously unvaccinated person with asplenia or sickle cell disease or having a scheduled splenectomy should receive 1 dose of Hib vaccine. Regardless of previous immunization, a recipient of a hematopoietic stem cell transplant  should receive a 3-dose series 6-12 months after her successful transplant. Hib vaccine is not recommended for adults with HIV infection. Preventive Services / Frequency Ages 71 to 87 years  Blood pressure check.** / Every 3-5 years.  Lipid and cholesterol check.** / Every 5 years beginning at age 1.  Clinical breast exam.** / Every 3 years for women in their 3s and 31s.  BRCA-related cancer risk assessment.** / For women who have family members with a BRCA-related cancer (breast, ovarian, tubal, or peritoneal cancers).  Pap test.** / Every 2 years from ages 50 through 86. Every 3 years starting at age 87 through age 7 or 75 with a history of 3 consecutive normal Pap tests.  HPV screening.** / Every 3 years from ages 59 through ages 35 to 6 with a history of 3 consecutive normal Pap tests.  Hepatitis C blood test.** / For any individual with known risks for hepatitis C.  Skin self-exam. / Monthly.  Influenza vaccine. / Every year.  Tetanus, diphtheria, and acellular pertussis (Tdap, Td) vaccine.** / Consult your health care provider. Pregnant women should receive 1 dose of Tdap vaccine during each pregnancy. 1 dose of Td every 10 years.  Varicella vaccine.** / Consult your health care provider. Pregnant females who do not have evidence of immunity should receive the first dose after pregnancy.  HPV vaccine. / 3 doses over 6 months, if 72 and younger. The vaccine is not recommended for use in pregnant females. However, pregnancy testing is not needed before receiving a dose.  Measles, mumps, rubella (MMR) vaccine.** / You need at least 1 dose of MMR if you were born in 1957 or later. You may also need a 2nd dose. For females of childbearing age, rubella immunity should be determined. If there is no evidence of immunity, females who are not pregnant should be vaccinated. If there is no evidence of immunity, females who are  pregnant should delay immunization until after  pregnancy.  Pneumococcal 13-valent conjugate (PCV13) vaccine.** / Consult your health care provider.  Pneumococcal polysaccharide (PPSV23) vaccine.** / 1 to 2 doses if you smoke cigarettes or if you have certain conditions.  Meningococcal vaccine.** / 1 dose if you are age 87 to 44 years and a Market researcher living in a residence hall, or have one of several medical conditions, you need to get vaccinated against meningococcal disease. You may also need additional booster doses.  Hepatitis A vaccine.** / Consult your health care provider.  Hepatitis B vaccine.** / Consult your health care provider.  Haemophilus influenzae type b (Hib) vaccine.** / Consult your health care provider. Ages 86 to 38 years  Blood pressure check.** / Every year.  Lipid and cholesterol check.** / Every 5 years beginning at age 49 years.  Lung cancer screening. / Every year if you are aged 71-80 years and have a 30-pack-year history of smoking and currently smoke or have quit within the past 15 years. Yearly screening is stopped once you have quit smoking for at least 15 years or develop a health problem that would prevent you from having lung cancer treatment.  Clinical breast exam.** / Every year after age 51 years.  BRCA-related cancer risk assessment.** / For women who have family members with a BRCA-related cancer (breast, ovarian, tubal, or peritoneal cancers).  Mammogram.** / Every year beginning at age 18 years and continuing for as long as you are in good health. Consult with your health care provider.  Pap test.** / Every 3 years starting at age 63 years through age 37 or 57 years with a history of 3 consecutive normal Pap tests.  HPV screening.** / Every 3 years from ages 41 years through ages 76 to 23 years with a history of 3 consecutive normal Pap tests.  Fecal occult blood test (FOBT) of stool. / Every year beginning at age 36 years and continuing until age 51 years. You may not need  to do this test if you get a colonoscopy every 10 years.  Flexible sigmoidoscopy or colonoscopy.** / Every 5 years for a flexible sigmoidoscopy or every 10 years for a colonoscopy beginning at age 36 years and continuing until age 35 years.  Hepatitis C blood test.** / For all people born from [birth year] through 1965 and any individual with known risks for hepatitis C.  Skin self-exam. / Monthly.  Influenza vaccine. / Every year.  Tetanus, diphtheria, and acellular pertussis (Tdap/Td) vaccine.** / Consult your health care provider. Pregnant women should receive 1 dose of Tdap vaccine during each pregnancy. 1 dose of Td every 10 years.  Varicella vaccine.** / Consult your health care provider. Pregnant females who do not have evidence of immunity should receive the first dose after pregnancy.  Zoster vaccine.** / 1 dose for adults aged 73 years or older.  Measles, mumps, rubella (MMR) vaccine.** / You need at least 1 dose of MMR if you were born in 1957 or later. You may also need a second dose. For females of childbearing age, rubella immunity should be determined. If there is no evidence of immunity, females who are not pregnant should be vaccinated. If there is no evidence of immunity, females who are pregnant should delay immunization until after pregnancy.  Pneumococcal 13-valent conjugate (PCV13) vaccine.** / Consult your health care provider.  Pneumococcal polysaccharide (PPSV23) vaccine.** / 1 to 2 doses if you smoke cigarettes or if you have certain conditions.  Meningococcal vaccine.** /  Consult your health care provider.  Hepatitis A vaccine.** / Consult your health care provider.  Hepatitis B vaccine.** / Consult your health care provider.  Haemophilus influenzae type b (Hib) vaccine.** / Consult your health care provider. Ages 80 years and over  Blood pressure check.** / Every year.  Lipid and cholesterol check.** / Every 5 years beginning at age 62 years.  Lung cancer  screening. / Every year if you are aged 32-80 years and have a 30-pack-year history of smoking and currently smoke or have quit within the past 15 years. Yearly screening is stopped once you have quit smoking for at least 15 years or develop a health problem that would prevent you from having lung cancer treatment.  Clinical breast exam.** / Every year after age 61 years.  BRCA-related cancer risk assessment.** / For women who have family members with a BRCA-related cancer (breast, ovarian, tubal, or peritoneal cancers).  Mammogram.** / Every year beginning at age 39 years and continuing for as long as you are in good health. Consult with your health care provider.  Pap test.** / Every 3 years starting at age 85 years through age 74 or 72 years with 3 consecutive normal Pap tests. Testing can be stopped between 65 and 70 years with 3 consecutive normal Pap tests and no abnormal Pap or HPV tests in the past 10 years.  HPV screening.** / Every 3 years from ages 55 years through ages 67 or 77 years with a history of 3 consecutive normal Pap tests. Testing can be stopped between 65 and 70 years with 3 consecutive normal Pap tests and no abnormal Pap or HPV tests in the past 10 years.  Fecal occult blood test (FOBT) of stool. / Every year beginning at age 81 years and continuing until age 22 years. You may not need to do this test if you get a colonoscopy every 10 years.  Flexible sigmoidoscopy or colonoscopy.** / Every 5 years for a flexible sigmoidoscopy or every 10 years for a colonoscopy beginning at age 67 years and continuing until age 22 years.  Hepatitis C blood test.** / For all people born from [birth year] through 1964 and any individual with known risks for hepatitis C.  Osteoporosis screening.** / A one-time screening for women ages 8 years and over and women at risk for fractures or osteoporosis.  Skin self-exam. / Monthly.  Influenza vaccine. / Every year.  Tetanus, diphtheria, and  acellular pertussis (Tdap/Td) vaccine.** / 1 dose of Td every 10 years.  Varicella vaccine.** / Consult your health care provider.  Zoster vaccine.** / 1 dose for adults aged 56 years or older.  Pneumococcal 13-valent conjugate (PCV13) vaccine.** / Consult your health care provider.  Pneumococcal polysaccharide (PPSV23) vaccine.** / 1 dose for all adults aged 15 years and older.  Meningococcal vaccine.** / Consult your health care provider.  Hepatitis A vaccine.** / Consult your health care provider.  Hepatitis B vaccine.** / Consult your health care provider.  Haemophilus influenzae type b (Hib) vaccine.** / Consult your health care provider. ** Family history and personal history of risk and conditions may change your health care provider's recommendations.   This information is not intended to replace advice given to you by your health care provider. Make sure you discuss any questions you have with your health care provider.   Document Released: 07/27/2001 Document Revised: 06/21/2014 Document Reviewed: 10/26/2010 Elsevier Interactive Patient Education [employer].

## 2016-04-13 NOTE — Progress Notes (Signed)
Subjective:   Cheryl Bond is a 45 y.o. G64P2000 Caucasian female here for a routine well-woman exam.  Patient's last menstrual period was 03/26/2016.    Current complaints: none, menses monthly PCP: none       does desire labs  Social History: Sexual: heterosexual Marital Status: married Living situation: with family Occupation: Therapist, sports in ED at [employer] Tobacco/alcohol: no tobacco use Illicit drugs: no history of illicit drug use  The following portions of the patient's history were reviewed and updated as appropriate: allergies, current medications, past family history, past medical history, past social history, past surgical history and problem list.  Past Medical History Past Medical History:  Diagnosis Date  . Fibrocystic breast   . Menorrhagia   . Sleep pattern disturbance     Past Surgical History Past Surgical History:  Procedure Laterality Date  . BREAST BIOPSY    . BREAST SURGERY     RIGHT BREAST [phone]  . TOENAIL SURGERY     BI-LATERAL GREAT TOENAILS REMOVED    Gynecologic History G2P2000  Patient's last menstrual period was 03/26/2016. Contraception: vasectomy Last Pap: 2016. Results were: normal Last mammogram: 2015. Results were: normal   Obstetric History OB History  Gravida Para Term Preterm AB Living  2 2 2         SAB TAB Ectopic Multiple Live Births               # Outcome Date GA Lbr Len/2nd Weight Sex Delivery Anes PTL Lv  2 Term           1 Term               Current Medications Current Outpatient Prescriptions on File Prior to Visit  Medication Sig Dispense Refill  . ciclopirox (PENLAC) 8 % solution Apply topically at bedtime. Apply over nail and surrounding skin. Apply daily over previous coat. After seven (7) days, may remove with alcohol and continue cycle. (Patient not taking: Reported on 04/13/2016) 6.6 mL 3  . Efinaconazole 10 % SOLN Apply 1 drop topically daily. (Patient not taking: Reported on 04/13/2016) 4 mL 11   No current  facility-administered medications on file prior to visit.     Review of Systems Patient denies any headaches, blurred vision, shortness of breath, chest pain, abdominal pain, problems with bowel movements, urination, or intercourse.  Objective:  BP 112/70   Pulse 68   Ht 5' 6"  (1.676 m)   Wt 164 lb 12.8 oz (74.8 kg)   LMP 03/26/2016   BMI 26.60 kg/m  Physical Exam  General:  Well developed, well nourished, no acute distress. She is alert and oriented x3. Skin:  Warm and dry Neck:  Midline trachea, no thyromegaly or nodules Cardiovascular: Regular rate and rhythm, no murmur heard Lungs:  Effort normal, all lung fields clear to auscultation bilaterally Breasts:  No dominant palpable mass, retraction, or nipple discharge Abdomen:  Soft, non tender, no hepatosplenomegaly or masses Pelvic:  External genitalia is normal in appearance.  The vagina is normal in appearance. The cervix is bulbous, no CMT.  Uterus is felt to be normal size, shape, and contour.  No adnexal masses or tenderness noted. Extremities:  No swelling or varicosities noted Psych:  She has a normal mood and affect  Assessment:   Healthy well-woman exam  Plan:  Labs obtained F/U 1 year for AE, or sooner if needed Mammogram ordered  [name], CNM

## 2016-04-14 LAB — LIPID PANEL
CHOLESTEROL TOTAL: 226 mg/dL — AB (ref 100–199)
Chol/HDL Ratio: 2.5 ratio units (ref 0.0–4.4)
HDL: 89 mg/dL (ref >39)
LDL CALC: 117 mg/dL — AB (ref 0–99)
TRIGLYCERIDES: 100 mg/dL (ref 0–149)
VLDL CHOLESTEROL CAL: 20 mg/dL (ref 5–40)

## 2016-04-14 LAB — COMPREHENSIVE METABOLIC PANEL
A/G RATIO: 1.5 (ref 1.2–2.2)
ALK PHOS: 46 IU/L (ref 39–117)
ALT: 10 IU/L (ref 0–32)
AST: 9 IU/L (ref 0–40)
Albumin: 4.3 g/dL (ref 3.5–5.5)
BUN/Creatinine Ratio: 16 (ref 9–23)
BUN: 13 mg/dL (ref 6–24)
Bilirubin Total: 0.4 mg/dL (ref 0.0–1.2)
CHLORIDE: 101 mmol/L (ref 96–106)
CO2: 24 mmol/L (ref 18–29)
Calcium: 9.2 mg/dL (ref 8.7–10.2)
Creatinine, Ser: 0.83 mg/dL (ref 0.57–1.00)
GFR calc Af Amer: 99 mL/min/{1.73_m2} (ref >59)
GFR calc non Af Amer: 86 mL/min/{1.73_m2} (ref >59)
GLOBULIN, TOTAL: 2.9 g/dL (ref 1.5–4.5)
Glucose: 58 mg/dL — ABNORMAL LOW (ref 65–99)
POTASSIUM: 4.2 mmol/L (ref 3.5–5.2)
SODIUM: 140 mmol/L (ref 134–144)
Total Protein: 7.2 g/dL (ref 6.0–8.5)

## 2016-04-14 LAB — VITAMIN D 25 HYDROXY (VIT D DEFICIENCY, FRACTURES): VIT D 25 HYDROXY: 44.9 ng/mL (ref 30.0–100.0)

## 2016-05-17 ENCOUNTER — Telehealth: Payer: Self-pay | Admitting: Obstetrics and Gynecology

## 2016-05-17 NOTE — Telephone Encounter (Signed)
PT LEFT A VM AND SAID SHE WANTED YOU TO CALL HER

## 2016-05-17 NOTE — Telephone Encounter (Signed)
Called pt vm was left for her to call me back

## 2016-05-20 ENCOUNTER — Telehealth: Payer: Self-pay | Admitting: Obstetrics and Gynecology

## 2016-05-20 NOTE — Telephone Encounter (Signed)
This pt wants a rx for something, would not say.

## 2016-05-20 NOTE — Telephone Encounter (Signed)
Pt would like to go back on Phentermine, states she doesn't want to do the b- 12, will send mychart message to [place] in 4 weeks if you allow, pls advise

## 2016-05-21 ENCOUNTER — Other Ambulatory Visit: Payer: Self-pay | Admitting: Obstetrics and Gynecology

## 2016-05-21 MED ORDER — PHENTERMINE HCL 37.5 MG PO TABS: 37.5000 mg | ORAL_TABLET | Freq: Every day | ORAL | 2 refills | Status: DC

## 2016-05-21 NOTE — Telephone Encounter (Signed)
Faxed to [place]

## 2016-05-21 NOTE — Telephone Encounter (Signed)
done

## 2016-06-10 ENCOUNTER — Encounter: Payer: Self-pay | Admitting: Obstetrics and Gynecology

## 2016-09-08 ENCOUNTER — Encounter: Payer: Self-pay | Admitting: Physician Assistant

## 2016-09-08 ENCOUNTER — Ambulatory Visit: Payer: Self-pay | Admitting: Physician Assistant

## 2016-09-08 VITALS — BP 120/90 | HR 82 | Temp 98.8°F

## 2016-09-08 DIAGNOSIS — J0100 Acute maxillary sinusitis, unspecified: Secondary | ICD-10-CM

## 2016-09-08 MED ORDER — FLUTICASONE PROPIONATE 50 MCG/ACT NA SUSP: 2.0000 | Freq: Every day | NASAL | 6 refills | Status: DC

## 2016-09-08 MED ORDER — PREDNISONE 10 MG PO TABS: 30.0000 mg | ORAL_TABLET | Freq: Every day | ORAL | 0 refills | Status: DC

## 2016-09-08 MED ORDER — AZITHROMYCIN 250 MG PO TABS: ORAL_TABLET | ORAL | 0 refills | Status: DC

## 2016-09-08 NOTE — Progress Notes (Signed)
S: C/o runny nose and congestion for 3 days, no fever, chills, cp/sob, v/d; mucus is green and thick, cough is sporadic, c/o of facial and dental pain. Can't breathe through her nose  Using otc meds: dayquil, nyquil, other cold meds  O: PE: vitals wnl, nad, perrl eomi, normocephalic, tms dull, nasal mucosa red and swollen, throat injected, neck supple no lymph, lungs c t a, cv rrr, neuro intact  A:  Acute sinusitis   P: drink fluids, continue regular meds , use otc meds of choice, return if not improving in 5 days, return earlier if worsening , zpack, pred 22m qd x 3d, flonase

## 2016-10-07 ENCOUNTER — Encounter: Payer: Self-pay | Admitting: Obstetrics and Gynecology

## 2016-12-01 ENCOUNTER — Ambulatory Visit (INDEPENDENT_AMBULATORY_CARE_PROVIDER_SITE_OTHER): Payer: 59 | Admitting: Obstetrics and Gynecology

## 2016-12-01 ENCOUNTER — Encounter: Payer: Self-pay | Admitting: Obstetrics and Gynecology

## 2016-12-01 VITALS — BP 105/75 | HR 73 | Ht 66.0 in | Wt 172.2 lb

## 2016-12-01 DIAGNOSIS — N6341 Unspecified lump in right breast, subareolar: Secondary | ICD-10-CM

## 2016-12-01 NOTE — Progress Notes (Signed)
Subjective:     Patient ID: Cheryl Bond, female   DOB: 12/27/1970, 46 y.o.   MRN: [id number]  HPI Soreness in right breast noted this am, denies recent trauma or noticing similar symptoms in past. Just finished menses 3 days ago. Did not go for routine screening mammogram as she forgot. Does report h/o right breast cyst with surgical removal at age 31.   Review of Systems Negative except stated above     Objective:   Physical Exam A&Ox4 Well groomed female in no distress Blood pressure 105/75, pulse 73, height 5' 6"  (1.676 m), weight 172 lb 3.2 oz (78.1 kg), last menstrual period 11/25/2016. Breasts: left breast normal without mass, skin or nipple changes or axillary nodes, abnormal mass palpable in center under nipple- mobile, slightly tender, without skin changes or nipple discharge. 1x1cm    Assessment:     Right breast mass under nipple    Plan:     Diagnostic mammogram with ultrasound ordered. Will follow up accordingly  Lorelle Gibbs, CNM

## 2016-12-01 NOTE — Patient Instructions (Signed)
Breast Cyst A breast cyst is a sac in the breast that is filled with fluid. Breast cysts are usually noncancerous (benign). They are common among women, and they are most often located in the upper, outer portion of the breast. One or more cysts may develop. They form when fluid builds up inside of the breast glands. There are several types of breast cysts:  Macrocyst. This is a cyst that is about 2 inches (5.1 cm) across (in diameter).  Microcyst. This is a very small cyst that you cannot feel, but it can be seen with imaging tests such as an X-ray of the breast (mammogram) or ultrasound.  Galactocele. This is a cyst that contains milk. It may develop if you suddenly stop breastfeeding.  Breast cysts do not increase your risk of breast cancer. They usually disappear after menopause, unless you take artificial hormones (are on hormone therapy). What are the causes? The exact cause of breast cysts is not known. Possible causes include:  Blockage of tubes (ducts) in the breast glands, which leads to fluid buildup. Duct blockage may result from: ? Fibrocystic breast changes. This is a common, benign condition that occurs when women go through hormonal changes during the menstrual cycle. This is a common cause of multiple breast cysts. ? Overgrowth of breast tissue or breast glands. ? Scar tissue in the breast from previous surgery.  Changes in certain female hormones (estrogen and progesterone).  What increases the risk? You may be more likely to develop breast cysts if you have not gone through menopause. What are the signs or symptoms? Symptoms of a breast cyst may include:  Feeling one or more smooth, round, soft lumps (like grapes) in the breast that are easily moveable. The lump(s) may get bigger and more painful before your period and get smaller after your period.  Breast discomfort or pain.  How is this diagnosed? A cyst can be felt during a physical exam by your health care  provider. A mammogram and ultrasound will be done to confirm the diagnosis. Fluid may be removed from the cyst with a needle (fine-needle aspiration) and tested to make sure the cyst is not cancerous. How is this treated? Treatment may not be necessary. Your health care provider may monitor the cyst to see if it goes away on its own. If the cyst is uncomfortable or gets bigger, or if you do not like how the cyst makes your breast look, you may need treatment. Treatment may include:  Hormone treatment.  Fine-needle aspiration, to drain fluid from the cyst. There is a chance of the cyst coming back (recurring) after aspiration.  Surgery to remove the cyst.  Follow these instructions at home:  See your health care provider regularly. ? Get a yearly physical exam. ? If you are 36-22 years old, get a clinical breast exam every 1-3 years. After age 43, get this exam every year. ? Get mammograms as often as directed.  Do a breast self-exam every month, or as often as directed. Having many breast cysts, or "lumpy" breasts, may make it harder to feel for new lumps. Understand how your breasts normally look and feel, and write down any changes in your breasts so you can tell your health care provider about the changes. A breast self-exam involves: ? Comparing your breasts in the mirror. ? Looking for visible changes in your skin or nipples. ? Feeling for lumps or changes.  Take over-the-counter and prescription medicines only as told by your health care  provider.  Wear a supportive bra, especially when exercising.  Follow instructions from your health care provider about eating and drinking restrictions. ? Avoid caffeine. ? Cut down on salt (sodium) in what you eat and drink, especially before your menstrual period. Too much sodium can cause fluid buildup (retention), breast swelling, and discomfort.  Keep all follow-up visits as told your health care provider. This is important. Contact a  health care provider if:  You feel, or think you feel, a lump in your breast.  You notice that both breasts look or feel different than usual.  Your breast is still causing pain after your menstrual period is over.  You find new lumps or bumps that were not there before.  You feel lumps in your armpit (axilla). Get help right away if:  You have severe pain, tenderness, redness, or warmth in your breast.  You have fluid or blood leaking from your nipple.  Your breast lump becomes hard and painful.  You notice dimpling or wrinkling of the breast or nipple. This information is not intended to replace advice given to you by your health care provider. Make sure you discuss any questions you have with your health care provider. Document Released: 05/31/2005 Document Revised: 02/20/2016 Document Reviewed: 02/20/2016 Elsevier Interactive Patient Education  2017 [employer].

## 2016-12-02 ENCOUNTER — Other Ambulatory Visit: Payer: Self-pay

## 2016-12-02 ENCOUNTER — Encounter: Payer: Self-pay | Admitting: Obstetrics and Gynecology

## 2016-12-02 ENCOUNTER — Encounter: Payer: 59 | Admitting: Obstetrics and Gynecology

## 2016-12-09 ENCOUNTER — Other Ambulatory Visit: Payer: Self-pay | Admitting: Obstetrics and Gynecology

## 2016-12-09 ENCOUNTER — Ambulatory Visit
Admission: RE | Admit: 2016-12-09 | Discharge: 2016-12-09 | Disposition: A | Discharge status: Home / Self Care | Payer: 59 | Source: Ambulatory Visit | Attending: Obstetrics and Gynecology | Admitting: Obstetrics and Gynecology

## 2016-12-09 ENCOUNTER — Other Ambulatory Visit: Payer: Self-pay | Admitting: *Deleted

## 2016-12-09 DIAGNOSIS — N6310 Unspecified lump in the right breast, unspecified quadrant: Secondary | ICD-10-CM | POA: Diagnosis not present

## 2016-12-09 DIAGNOSIS — N6341 Unspecified lump in right breast, subareolar: Secondary | ICD-10-CM | POA: Insufficient documentation

## 2016-12-09 DIAGNOSIS — R922 Inconclusive mammogram: Secondary | ICD-10-CM | POA: Diagnosis not present

## 2016-12-09 DIAGNOSIS — N6489 Other specified disorders of breast: Secondary | ICD-10-CM | POA: Diagnosis not present

## 2016-12-09 DIAGNOSIS — R928 Other abnormal and inconclusive findings on diagnostic imaging of breast: Secondary | ICD-10-CM

## 2016-12-09 MED ORDER — VALACYCLOVIR HCL 500 MG PO TABS: 500.0000 mg | ORAL_TABLET | Freq: Two times a day (BID) | ORAL | 2 refills | Status: AC

## 2016-12-30 ENCOUNTER — Ambulatory Visit
Admission: RE | Admit: 2016-12-30 | Discharge: 2016-12-30 | Disposition: A | Discharge status: Home / Self Care | Payer: 59 | Source: Ambulatory Visit | Attending: Obstetrics and Gynecology | Admitting: Obstetrics and Gynecology

## 2016-12-30 DIAGNOSIS — N6011 Diffuse cystic mastopathy of right breast: Secondary | ICD-10-CM | POA: Diagnosis not present

## 2016-12-30 DIAGNOSIS — N6310 Unspecified lump in the right breast, unspecified quadrant: Secondary | ICD-10-CM | POA: Diagnosis present

## 2016-12-30 DIAGNOSIS — N6001 Solitary cyst of right breast: Secondary | ICD-10-CM | POA: Insufficient documentation

## 2016-12-30 DIAGNOSIS — R928 Other abnormal and inconclusive findings on diagnostic imaging of breast: Secondary | ICD-10-CM

## 2017-04-15 ENCOUNTER — Encounter: Payer: Self-pay | Admitting: Obstetrics and Gynecology

## 2017-04-15 ENCOUNTER — Ambulatory Visit (INDEPENDENT_AMBULATORY_CARE_PROVIDER_SITE_OTHER): Payer: 59 | Admitting: Obstetrics and Gynecology

## 2017-04-15 VITALS — BP 100/73 | HR 67 | Ht 65.0 in | Wt 171.9 lb

## 2017-04-15 DIAGNOSIS — E663 Overweight: Secondary | ICD-10-CM | POA: Diagnosis not present

## 2017-04-15 DIAGNOSIS — Z01419 Encounter for gynecological examination (general) (routine) without abnormal findings: Secondary | ICD-10-CM | POA: Diagnosis not present

## 2017-04-15 MED ORDER — PHENDIMETRAZINE TARTRATE ER 105 MG PO CP24: 1.0000 | ORAL_CAPSULE | Freq: Every day | ORAL | 2 refills | Status: AC

## 2017-04-15 NOTE — Patient Instructions (Signed)
Place annual gynecologic exam patient instructions here.

## 2017-04-15 NOTE — Progress Notes (Signed)
Subjective:   Cheryl Bond is a 46 y.o. G45P2000 Caucasian female here for a routine well-woman exam.  Patient's last menstrual period was 04/03/2017.    Current complaints: weight gain- couldn't tolerate adipex due to fast heart rate- desires trial of bontril. PCP: Gayla Medicus       doesn't desire labs  Social History: Sexual: heterosexual Marital Status: married Living situation: with spouse Occupation: Therapist, sports Tobacco/alcohol: no tobacco use Illicit drugs: no history of illicit drug use  The following portions of the patient's history were reviewed and updated as appropriate: allergies, current medications, past family history, past medical history, past social history, past surgical history and problem list.  Past Medical History Past Medical History:  Diagnosis Date  . Fibrocystic breast   . Menorrhagia   . Sleep pattern disturbance     Past Surgical History Past Surgical History:  Procedure Laterality Date  . BREAST EXCISIONAL BIOPSY Right 1993   age 79  . BREAST SURGERY     RIGHT BREAST [phone]  . TOENAIL SURGERY     BI-LATERAL GREAT TOENAILS REMOVED    Gynecologic History G2P2000  Patient's last menstrual period was 04/03/2017. Contraception: vasectomy Last Pap: 2016. Results were: normal Last mammogram: 2018. Results were: bilateral cyst with f/u normal   Obstetric History OB History  Gravida Para Term Preterm AB Living  2 2 2         SAB TAB Ectopic Multiple Live Births               # Outcome Date GA Lbr Len/2nd Weight Sex Delivery Anes PTL Lv  2 Term           1 Term               Current Medications Current Outpatient Prescriptions on File Prior to Visit  Medication Sig Dispense Refill  . valACYclovir (VALTREX) 500 MG tablet Take 1 tablet (500 mg total) by mouth 2 (two) times daily. (Patient not taking: Reported on 04/15/2017) 60 tablet 2   No current facility-administered medications on file prior to visit.     Review of Systems Patient denies  any headaches, blurred vision, shortness of breath, chest pain, abdominal pain, problems with bowel movements, urination, or intercourse.  Objective:  BP 100/73   Pulse 67   Ht 5' 5"  (1.651 m)   Wt 171 lb 14.4 oz (78 kg)   LMP 04/03/2017   BMI 28.61 kg/m  Physical Exam  General:  Well developed, well nourished, no acute distress. She is alert and oriented x3. Skin:  Warm and dry Neck:  Midline trachea, no thyromegaly or nodules Cardiovascular: Regular rate and rhythm, no murmur heard Lungs:  Effort normal, all lung fields clear to auscultation bilaterally Breasts:  No dominant palpable mass, retraction, or nipple discharge Abdomen:  Soft, non tender, no hepatosplenomegaly or masses Pelvic:  External genitalia is normal in appearance.  The vagina is normal in appearance. The cervix is bulbous, no CMT.  Thin prep pap is not done . Uterus is felt to be normal size, shape, and contour.  No adnexal masses or tenderness noted. Extremities:  No swelling or varicosities noted Psych:  She has a normal mood and affect  Assessment:   Healthy well-woman exam Overweight HSV1  Plan:  Desires trial of bontril- rx given, will send weights monthly. F/U 1 year for AE, or sooner if needed Mammogram due in June  Melody N Shambley, [place]

## 2017-08-04 DIAGNOSIS — B070 Plantar wart: Secondary | ICD-10-CM | POA: Diagnosis not present

## 2017-08-25 IMAGING — US US BREAST*R* LIMITED INC AXILLA
1 series · 13 of 25 positions shown · non-contrast
Comparison: Previous exam(s).

CLINICAL DATA: 46-year-old female presenting for evaluation of
palpable lumps in the right breast.The patient states she has had
unilateral spontaneous and non spontaneous nipple discharge for the
past couple of years which is yellowish in color. She has had
hormonal evaluation which was normal. She started having pain in her
right breast 1-2 weeks ago.

EXAM:
2D DIGITAL DIAGNOSTIC BILATERAL MAMMOGRAM WITH CAD AND ADJUNCT TOMO
RIGHT BREAST ULTRASOUND

[Series 1: us breast*right* limited inc axilla · 0.06mm/px · 13 of 26 slices shown]
[im 1/26]
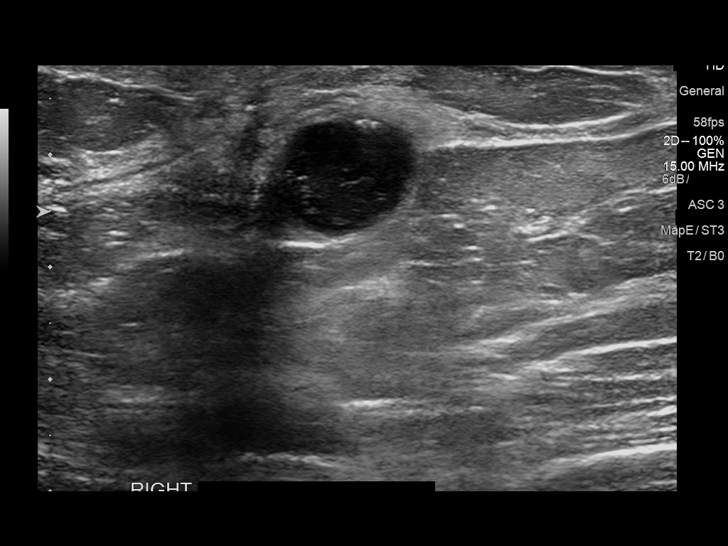
[im 3/26]
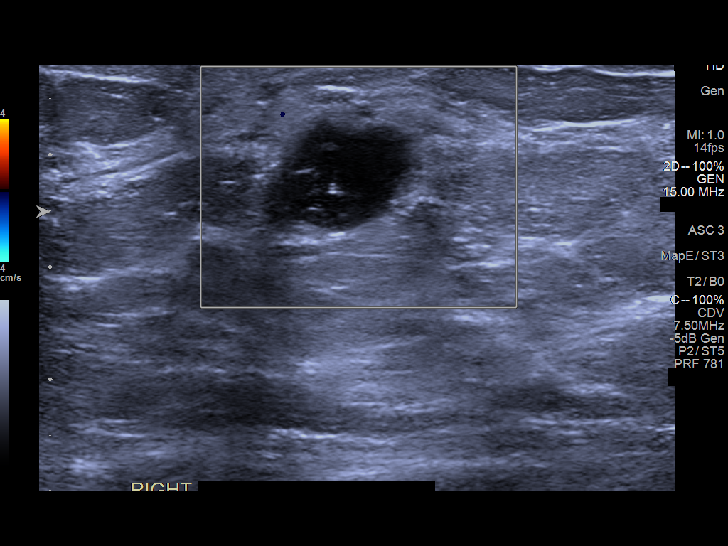
[im 5/26]
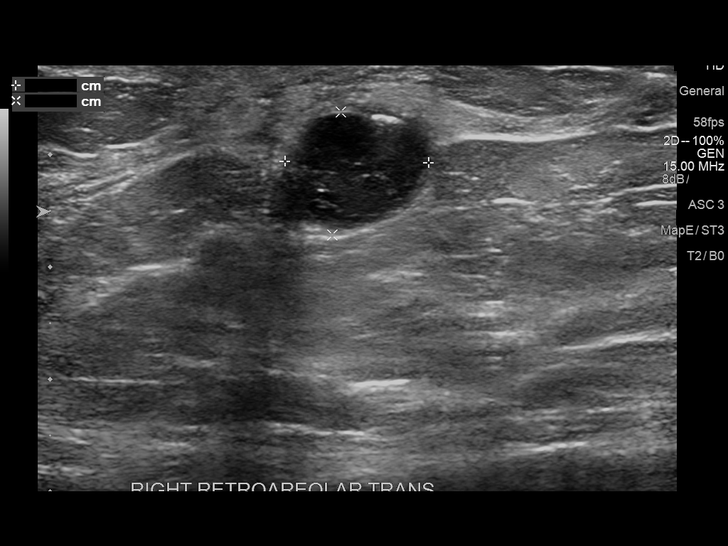
[im 7/26]
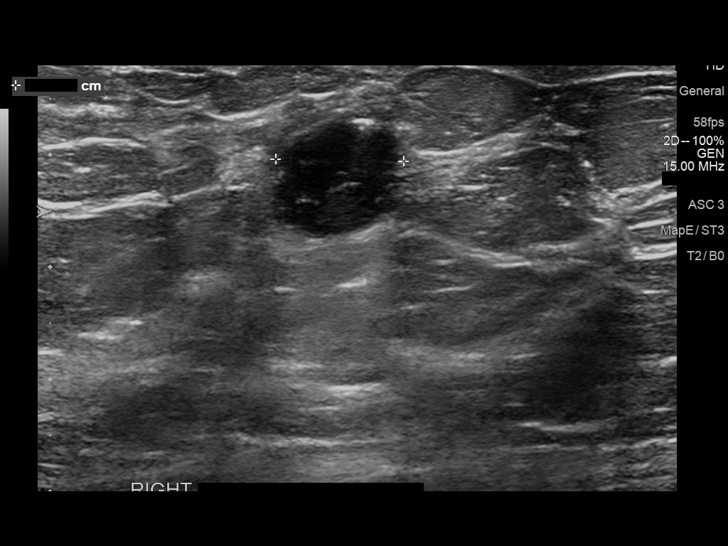
[im 9/26]
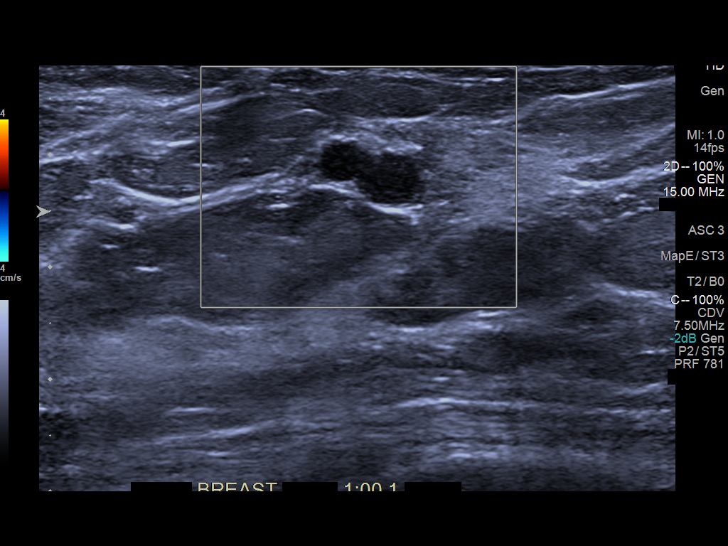
[im 11/26]
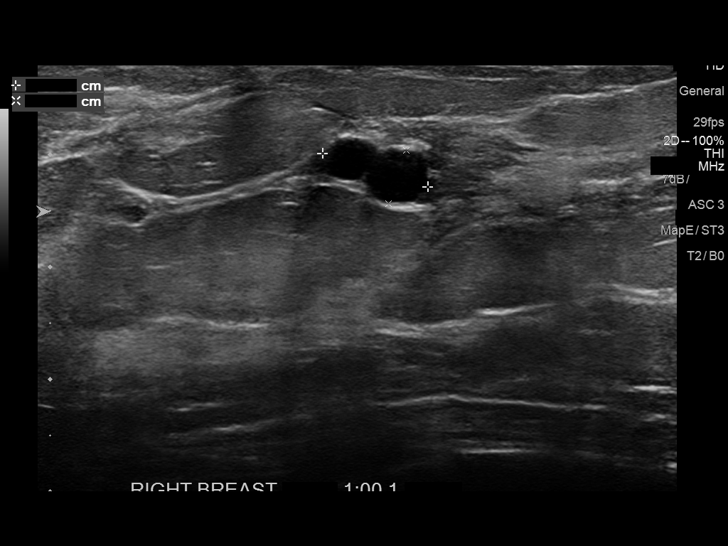
[im 13/26]
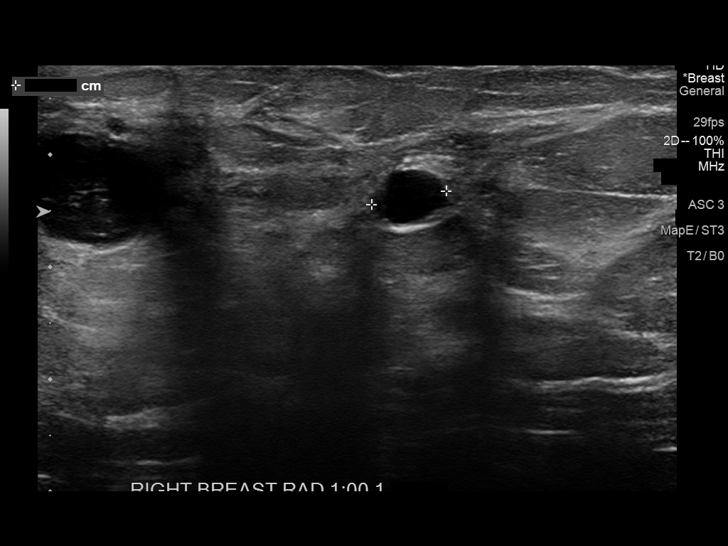
[im 15/26]
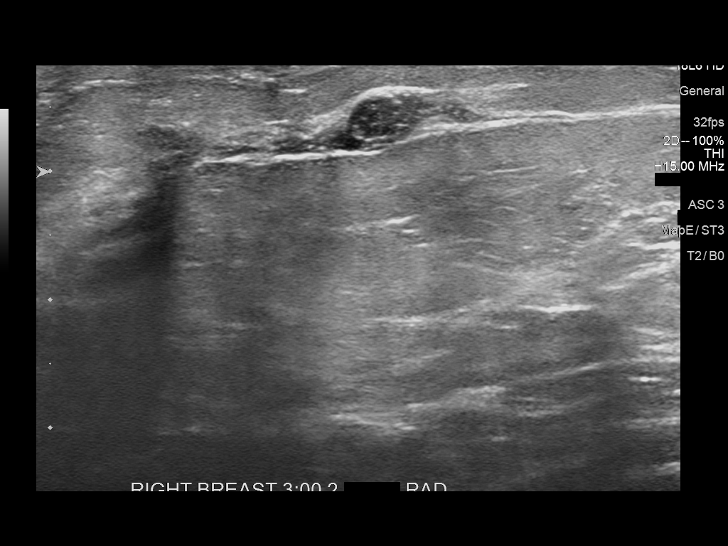
[im 17/26]
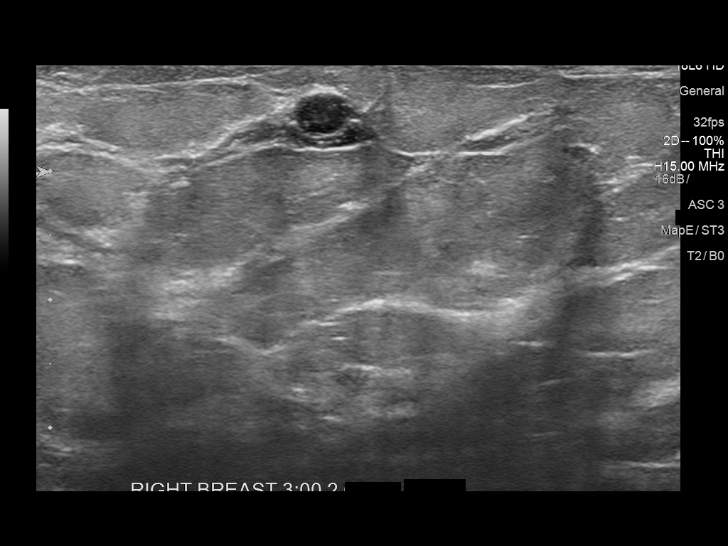
[im 19/26]
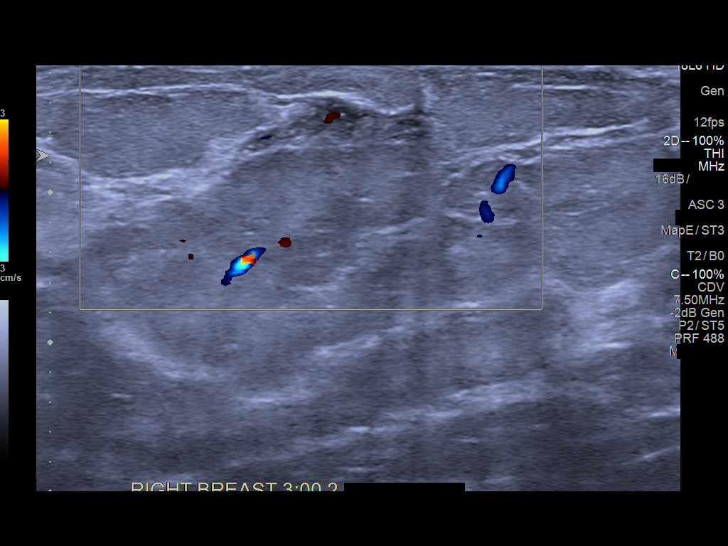
[im 21/26]
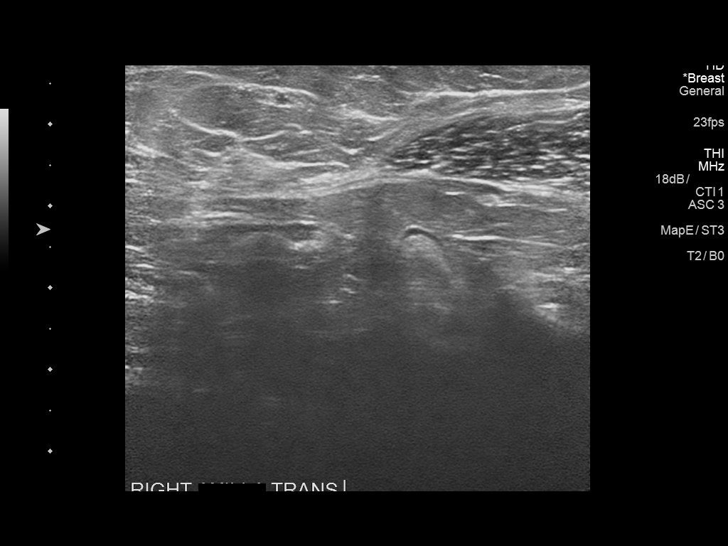
[im 23/26]
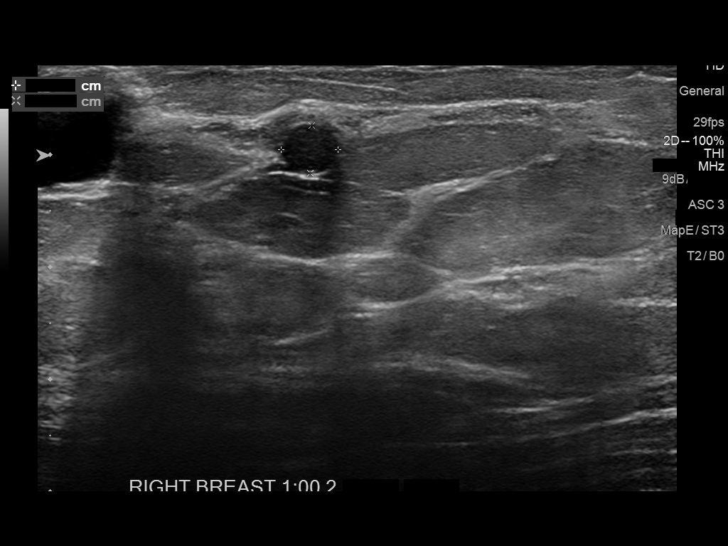
[im 26/26]
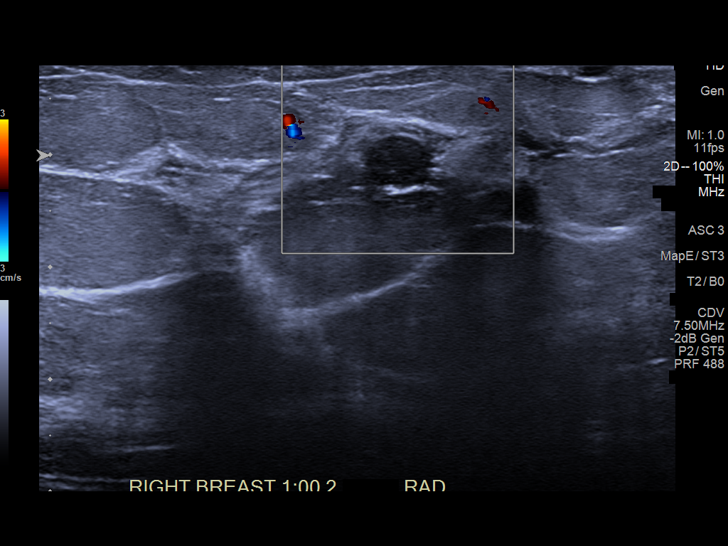

[13 of 25 positions shown; findings below may reference images not displayed]

ACR Breast Density Category c: The breast tissue is heterogeneously
dense, which may obscure small masses.
FINDINGS: A BB has been placed on the inferior in the superior aspect the
right breast adjacent to the nipple. No mammographic abnormalities
are identified on the site inferior to the nipple, however above the
nipple there are 3 post cured partially circumscribed masses, 2 of
which are deep to the superior palpable marker. No other suspicious
calcifications, masses or areas of distortion are seen in the
bilateral breasts.

Mammographic images were processed with CAD.

Physical exam of the right subareolar breast demonstrates that there
is a firm nodule of in the upper slightly inner quadrant.

Ultrasound targeted to the subareolar right breast demonstrates
multiple oval masses, the largest of which is in the retroareolar
right breast measuring 1.3 x 1.1 x 1.1 cm which is hypoechoic. No
internal blood flow was identified on color Doppler imaging.

There is a bilobed cyst in the right breast at 1 o'clock, 1 cm from
the nipple measuring 1 x 5 x 7 mm.

There is a hypoechoic oval circumscribed mass in the right breast at
3 o'clock, 2 cm from the nipple measuring 5 x 4 x 3 mm.

In the right breast at 1 o'clock, 2 cm from the nipple there is a
similar-appearing hypoechoic oval circumscribed mass measuring 6 x 4
x 5 mm.

No abnormal lymph nodes are identified in the right axilla.
IMPRESSION: 1. There are 3 hypoechoic circumscribed masses in the right breast
which are favored to represent complicated cysts, however solid
masses cannot be excluded.

2.  No evidence of right axillary lymphadenopathy.

3.  No mammographic evidence of malignancy in the left breast.

RECOMMENDATION:
Ultrasound-guided aspiration versus possible biopsy is recommended
for the 3 masses in the right breast. One is retroareolar, 1 is at 1
o'clock, 2 cm from the nipple have and another at 3 o'clock 2 cm
from the nipple. Per the patient's request, this will be scheduled
following her vacation which begins tomorrow.

I have discussed the findings and recommendations with the patient.
Results were also provided in writing at the conclusion of the
visit. If applicable, a reminder letter will be sent to the patient
regarding the next appointment.

BI-RADS CATEGORY  4: Suspicious.

## 2018-04-19 ENCOUNTER — Encounter: Payer: 59 | Admitting: Obstetrics and Gynecology
